# Patient Record
Sex: Female | Born: 1945 | Race: White | Hispanic: No | Marital: Married | Smoking: Former smoker
Health system: Southern US, Community
[De-identification: ages and names within clinical notes are randomized; demographics above are authoritative.]

## PROBLEM LIST (undated history)

## (undated) DIAGNOSIS — J45909 Unspecified asthma, uncomplicated: Secondary | ICD-10-CM

## (undated) DIAGNOSIS — C801 Malignant (primary) neoplasm, unspecified: Secondary | ICD-10-CM

## (undated) DIAGNOSIS — I10 Essential (primary) hypertension: Secondary | ICD-10-CM

## (undated) HISTORY — PX: PARATHYROIDECTOMY: SHX19

## (undated) HISTORY — PX: BACK SURGERY: SHX140

---

## 1998-05-22 ENCOUNTER — Emergency Department (HOSPITAL_COMMUNITY): Admission: EM | Admit: 1998-05-22 | Discharge: 1998-05-22 | Payer: Self-pay | Admitting: Emergency Medicine

## 1998-05-22 ENCOUNTER — Encounter: Payer: Self-pay | Admitting: Emergency Medicine

## 1998-05-24 ENCOUNTER — Emergency Department (HOSPITAL_COMMUNITY): Admission: EM | Admit: 1998-05-24 | Discharge: 1998-05-24 | Payer: Self-pay | Admitting: Emergency Medicine

## 1998-05-25 ENCOUNTER — Encounter: Payer: Self-pay | Admitting: Emergency Medicine

## 2006-02-10 ENCOUNTER — Ambulatory Visit: Payer: Self-pay | Admitting: Family Medicine

## 2006-05-23 ENCOUNTER — Ambulatory Visit: Payer: Self-pay | Admitting: Gastroenterology

## 2006-08-12 ENCOUNTER — Ambulatory Visit: Payer: Self-pay | Admitting: Internal Medicine

## 2007-02-13 ENCOUNTER — Ambulatory Visit: Payer: Self-pay | Admitting: Gastroenterology

## 2007-04-27 DIAGNOSIS — C801 Malignant (primary) neoplasm, unspecified: Secondary | ICD-10-CM

## 2007-04-27 HISTORY — DX: Malignant (primary) neoplasm, unspecified: C80.1

## 2007-11-27 ENCOUNTER — Ambulatory Visit: Payer: Self-pay | Admitting: Family Medicine

## 2008-09-04 ENCOUNTER — Ambulatory Visit: Payer: Self-pay | Admitting: Family Medicine

## 2012-04-07 ENCOUNTER — Ambulatory Visit: Payer: Self-pay | Admitting: Gastroenterology

## 2012-08-08 ENCOUNTER — Ambulatory Visit: Payer: Self-pay | Admitting: Neurology

## 2012-08-17 ENCOUNTER — Ambulatory Visit: Payer: Self-pay | Admitting: Neurology

## 2012-09-11 ENCOUNTER — Ambulatory Visit: Payer: Self-pay | Admitting: General Practice

## 2012-09-11 LAB — CBC WITH DIFFERENTIAL/PLATELET
Basophil #: 0.1 10*3/uL (ref 0.0–0.1)
Basophil %: 1 %
Eosinophil #: 0.2 10*3/uL (ref 0.0–0.7)
Eosinophil %: 3.1 %
HCT: 37.7 % (ref 35.0–47.0)
HGB: 12.7 g/dL (ref 12.0–16.0)
Lymphocyte #: 1.6 10*3/uL (ref 1.0–3.6)
Lymphocyte %: 26.9 %
MCH: 29.9 pg (ref 26.0–34.0)
MCHC: 33.8 g/dL (ref 32.0–36.0)
MCV: 89 fL (ref 80–100)
Monocyte #: 0.4 x10 3/mm (ref 0.2–0.9)
Monocyte %: 7.6 %
Neutrophil #: 3.6 10*3/uL (ref 1.4–6.5)
Neutrophil %: 61.4 %
Platelet: 242 10*3/uL (ref 150–440)
RBC: 4.25 10*6/uL (ref 3.80–5.20)
RDW: 14 % (ref 11.5–14.5)
WBC: 5.8 10*3/uL (ref 3.6–11.0)

## 2012-09-15 ENCOUNTER — Ambulatory Visit: Payer: Self-pay | Admitting: General Practice

## 2013-01-25 ENCOUNTER — Emergency Department: Payer: Self-pay | Admitting: Emergency Medicine

## 2013-01-25 LAB — COMPREHENSIVE METABOLIC PANEL
Albumin: 3.7 g/dL (ref 3.4–5.0)
Alkaline Phosphatase: 104 U/L (ref 50–136)
Anion Gap: 8 (ref 7–16)
BUN: 15 mg/dL (ref 7–18)
Bilirubin,Total: 0.3 mg/dL (ref 0.2–1.0)
Calcium, Total: 10 mg/dL (ref 8.5–10.1)
Chloride: 103 mmol/L (ref 98–107)
Co2: 27 mmol/L (ref 21–32)
Creatinine: 0.83 mg/dL (ref 0.60–1.30)
EGFR (African American): >60
EGFR (Non-African Amer.): >60
Glucose: 118 mg/dL — ABNORMAL HIGH (ref 65–99)
Osmolality: 278 (ref 275–301)
Potassium: 3.6 mmol/L (ref 3.5–5.1)
SGOT(AST): 28 U/L (ref 15–37)
SGPT (ALT): 31 U/L (ref 12–78)
Sodium: 138 mmol/L (ref 136–145)
Total Protein: 7.7 g/dL (ref 6.4–8.2)

## 2013-01-25 LAB — CBC
HCT: 40.7 % (ref 35.0–47.0)
HGB: 13.8 g/dL (ref 12.0–16.0)
MCH: 30 pg (ref 26.0–34.0)
MCHC: 33.9 g/dL (ref 32.0–36.0)
MCV: 88 fL (ref 80–100)
Platelet: 251 10*3/uL (ref 150–440)
RBC: 4.61 10*6/uL (ref 3.80–5.20)
RDW: 13.2 % (ref 11.5–14.5)
WBC: 7.9 10*3/uL (ref 3.6–11.0)

## 2013-04-08 ENCOUNTER — Ambulatory Visit: Payer: Self-pay | Admitting: Medical

## 2013-08-16 ENCOUNTER — Ambulatory Visit: Payer: Self-pay | Admitting: Family Medicine

## 2014-04-06 ENCOUNTER — Emergency Department: Payer: Self-pay | Admitting: Internal Medicine

## 2014-04-06 LAB — COMPREHENSIVE METABOLIC PANEL
ALK PHOS: 99 U/L
Albumin: 3.4 g/dL (ref 3.4–5.0)
Anion Gap: 7 (ref 7–16)
BUN: 17 mg/dL (ref 7–18)
Bilirubin,Total: 0.4 mg/dL (ref 0.2–1.0)
CALCIUM: 8.8 mg/dL (ref 8.5–10.1)
CHLORIDE: 105 mmol/L (ref 98–107)
Co2: 27 mmol/L (ref 21–32)
Creatinine: 0.8 mg/dL (ref 0.60–1.30)
EGFR (African American): >60
EGFR (Non-African Amer.): >60
Glucose: 105 mg/dL — ABNORMAL HIGH (ref 65–99)
Osmolality: 279 (ref 275–301)
Potassium: 4 mmol/L (ref 3.5–5.1)
SGOT(AST): 31 U/L (ref 15–37)
SGPT (ALT): 27 U/L
Sodium: 139 mmol/L (ref 136–145)
TOTAL PROTEIN: 7.5 g/dL (ref 6.4–8.2)

## 2014-04-06 LAB — CBC
HCT: 42.5 % (ref 35.0–47.0)
HGB: 13.7 g/dL (ref 12.0–16.0)
MCH: 29.3 pg (ref 26.0–34.0)
MCHC: 32.4 g/dL (ref 32.0–36.0)
MCV: 91 fL (ref 80–100)
Platelet: 265 10*3/uL (ref 150–440)
RBC: 4.69 10*6/uL (ref 3.80–5.20)
RDW: 13.7 % (ref 11.5–14.5)
WBC: 5.9 10*3/uL (ref 3.6–11.0)

## 2014-04-06 LAB — TROPONIN I

## 2014-08-16 NOTE — Consult Note (Signed)
PATIENT NAME:  Erica Morrow, Erica Morrow MR#:  [id number] DATE OF BIRTH:  1945/12/28  DATE OF CONSULTATION:  01/25/2013  REFERRING PHYSICIAN:  Graciella Freer CONSULTING PHYSICIAN:  Jerene Bears, MD  REASON FOR CONSULTATION: Respiratory distress.   HISTORY OF PRESENT ILLNESS: The patient is a 69 year old female who is brought today by EMS for respiratory distress and inspiratory stridor. She has a history of asthma, as well as cervical fusion, and presented to the Emergency Room. She was satting in the high 90s on room air, but due to the respiratory distress as well as audible stridor, Morrow was called for evaluation of her airway. The patient demonstrates she has had asthma attacks before in the past, but this feels different. She did try a new inhaler today that is different than before. She ate nothing out of the ordinary earlier today, and denies taking any new medications. She denies any fever or significant sick contacts.   PAST MEDICAL HISTORY: Atrial fibrillation and asthma.   PAST SURGICAL HISTORY:  Parathyroidectomy and cervical fusion.   SOCIAL HISTORY: The patient lives in [place], [place]. Works at [employer], and is here with her family member.   ALLERGIES: ADVAIR DISKUS, FIORINAL AND HYDROCHLOROTHIAZIDE.   CURRENT MEDICATIONS: ProAir, Paxil, Norco, metoprolol, lisinopril, gabapentin, etodolac.  PHYSICAL EXAMINATION: VITAL SIGNS: Temperature is 99. Pulse 100, respirations 20, blood pressure 143/68. O2 is 96% on room air.  GENERAL: She is a well-nourished, well-developed female in mild distress. There is some intermittent inspiratory stridor. She is able to phonate at times normally and other times at a whisper. She does have a good cough reflex.  HEENT:  Ears: EACs are clear bilaterally. TMs intact.EACS clear anteriorly. Oral cavity and oropharynx reveals no abnormal masses or lesions.  NECK: Supple. There is no substernal retractions.   PROCEDURE: Transnasal  flexible laryngoscopy.   PREPROCEDURE DIAGNOSIS: Respiratory distress.   POSTPROCEDURE DIAGNOSIS:  Respiratory distress.  DESCRIPTION OF PROCEDURE: Verbal consent was obtained. The patient's nasal cavity was sprayed with topical Afrin only, and transnasal flexible laryngoscopy was performed. This demonstrated no significant abnormal masses or lesions in the patient's nasal cavity, nasopharynx, pharynx or larynx. True vocal folds were mobile bilaterally. There is widely patent subglottic area. The postcricoid region revealed some mild edema consistent with reflux, but no significant erythema. Airway was widely patent. The patient did not have inspiratory stridor during first half of the scope, and then when asked to breathe deep, the vocal cords slammed together and created an inspiratory stridor sound, but the airway was widely patent.   IMPRESSION: Inspiratory stridor, respiratory distress in a patient with asthma with no evidence of abnormal mass or lesion or swelling on transnasal flexible laryngoscopy.   PLAN: Morrow discussed my findings with the patient, as well as Dr. Jasmine December. Morrow discussed with the patient that everything looks normal. She has normal movement of her vocal cords, but that she is actually creating the sound herself whenever she breathes in very deeply with the closure of her cords. Morrow have recommended further evaluation for her asthma exacerbation, but again, reassured her that her airway is normal in appearance. Morrow will see her back as needed.     ____________________________ Jerene Bears, MD ccv:dmm D: 01/25/2013 21:48:31 ET T: 01/25/2013 22:09:13 ET JOB#: [id number]  cc: Jerene Bears, MD, <Dictator> Jerene Bears MD ELECTRONICALLY SIGNED 02/07/2013 6:43

## 2014-08-16 NOTE — Op Note (Signed)
PATIENT NAME:  Erica Morrow, RAMAKER I MR#:  [id number] DATE OF BIRTH:  02-Sep-1945  DATE OF PROCEDURE:  09/15/2012  DATE OF PROCEDURE: 09/15/2012.   PREOPERATIVE DIAGNOSIS: Left carpal tunnel syndrome.   POSTOPERATIVE DIAGNOSIS: Left carpal tunnel syndrome.   PROCEDURE PERFORMED: Left carpal tunnel release.   SURGEON: Laurice Record. Hooten, MD  ANESTHESIA: General.   ESTIMATED BLOOD LOSS: Minimal.   TOURNIQUET TIME:  28 minutes.   DRAINS: None.   INDICATIONS FOR SURGERY:  The patient is a 69 year old female who has been seen for complaints of numbness and paresthesias to the left hand. EMG nerve conduction studies were consistent with moderately severe to severe left carpal tunnel syndrome. After discussion of the risks and benefits of surgical intervention, the patient expressed understanding of the risks and benefits and agreed with plans for surgical intervention.   PROCEDURE IN DETAIL: The patient was brought to the Operating Room and after adequate general anesthesia was achieved, a tourniquet was placed in the patient's upper left arm. The patient's left hand and arm were cleaned and prepped with alcohol and DuraPrep draped in the usual sterile fashion. A "timeout" was performed as per usual protocol. The left upper extremity was exsanguinated using an Esmarch, and the tourniquet was inflated to 250 mmHg. Magnification was used throughout the procedure. A curvilinear incision was made just ulnar to the thenar palmar crease. Dissection was carried down through the palmar fascia to the transverse carpal ligament. Transverse carpal ligament was sharply incised, taking care to protect the underlying structures within the carpal tunnel. Complete release of the transverse carpal ligament was achieved. Inspection of the canal showed no evidence of lipoma or ganglion cyst. The wound was irrigated with copious amounts of normal saline with antibiotic solution. Skin edges were then reapproximated using  interrupted sutures of 5-0 nylon. 10 mL of 0.25% Marcaine was injected along the incision site. Sterile dressing was applied followed by application of volar splint. Tourniquet was deflated after total tourniquet time of 28 minutes.   The patient tolerated the procedure well. She was transported to the recovery room in stable condition.     ____________________________ Laurice Record. Holley Bouche., MD jph:ct D: 09/15/2012 10:54:49 ET T: 09/15/2012 12:05:45 ET JOB#: [id number]  cc: Jeneen Rinks P. Holley Bouche., MD, <Dictator> Laurice Record Holley Bouche MD ELECTRONICALLY SIGNED 09/27/2012 19:42

## 2014-10-08 ENCOUNTER — Other Ambulatory Visit: Payer: Self-pay | Admitting: Family Medicine

## 2014-10-08 DIAGNOSIS — Z1231 Encounter for screening mammogram for malignant neoplasm of breast: Secondary | ICD-10-CM

## 2014-10-10 ENCOUNTER — Other Ambulatory Visit: Payer: Self-pay | Admitting: Family Medicine

## 2014-10-10 DIAGNOSIS — N63 Unspecified lump in unspecified breast: Secondary | ICD-10-CM

## 2014-10-10 DIAGNOSIS — Z1231 Encounter for screening mammogram for malignant neoplasm of breast: Secondary | ICD-10-CM

## 2014-10-31 ENCOUNTER — Ambulatory Visit: Payer: Medicare Other

## 2014-10-31 ENCOUNTER — Ambulatory Visit: Payer: Self-pay

## 2014-10-31 ENCOUNTER — Ambulatory Visit
Admission: RE | Admit: 2014-10-31 | Discharge: 2014-10-31 | Disposition: A | Discharge status: Home / Self Care | Payer: Medicare Other | Source: Ambulatory Visit | Attending: Family Medicine | Admitting: Family Medicine

## 2014-10-31 DIAGNOSIS — N6002 Solitary cyst of left breast: Secondary | ICD-10-CM | POA: Diagnosis not present

## 2014-10-31 DIAGNOSIS — Z1231 Encounter for screening mammogram for malignant neoplasm of breast: Secondary | ICD-10-CM

## 2014-10-31 DIAGNOSIS — N63 Unspecified lump in unspecified breast: Secondary | ICD-10-CM

## 2014-10-31 HISTORY — DX: Malignant (primary) neoplasm, unspecified: C80.1

## 2015-01-30 ENCOUNTER — Emergency Department
Admission: EM | Admit: 2015-01-30 | Discharge: 2015-01-30 | Disposition: A | Discharge status: Home / Self Care | Payer: Medicare Other | Attending: Emergency Medicine | Admitting: Emergency Medicine

## 2015-01-30 DIAGNOSIS — H9202 Otalgia, left ear: Secondary | ICD-10-CM | POA: Diagnosis present

## 2015-01-30 DIAGNOSIS — H6092 Unspecified otitis externa, left ear: Secondary | ICD-10-CM

## 2015-01-30 HISTORY — DX: Essential (primary) hypertension: I10

## 2015-01-30 HISTORY — DX: Unspecified asthma, uncomplicated: J45.909

## 2015-01-30 MED ORDER — OXYCODONE-ACETAMINOPHEN 5-325 MG PO TABS: 1.0000 | ORAL_TABLET | ORAL | Status: AC | PRN

## 2015-01-30 MED ORDER — NEOMYCIN-POLYMYXIN-HC 3.5-10000-1 OT SOLN: 3.0000 [drp] | Freq: Three times a day (TID) | OTIC | Status: AC

## 2015-01-30 NOTE — ED Notes (Signed)
Pt presents to ED with c'o LEFT ear pain since 9 pm last night. Pt denies any other c/o; denies core throat, fever, N/V. Denies any known trauma or injury, reports no recent trips to the beach or swimming. Pt reports last episode of earache was during childhood. Pt is &O, in NAD, with respirations regular, even and unlabored.

## 2015-01-30 NOTE — ED Notes (Signed)
Patient ambulatory to room 26.

## 2015-01-30 NOTE — ED Notes (Signed)
Pt reports not being able to hear anything out of the affected ear.

## 2015-01-30 NOTE — ED Notes (Signed)
Patient with left ear pain since 2100 last night. Denies drainage.

## 2015-01-30 NOTE — Discharge Instructions (Signed)
Please seek medical attention for any high fevers, chest pain, shortness of breath, change in behavior, persistent vomiting, bloody stool or any other new or concerning symptoms.   Otitis Externa Otitis externa is a bacterial or fungal infection of the outer ear canal. This is the area from the eardrum to the outside of the ear. Otitis externa is sometimes called "swimmer's ear." CAUSES  Possible causes of infection include:  Swimming in dirty water.  Moisture remaining in the ear after swimming or bathing.  Mild injury (trauma) to the ear.  Objects stuck in the ear (foreign body).  Cuts or scrapes (abrasions) on the outside of the ear. SIGNS AND SYMPTOMS  The first symptom of infection is often itching in the ear canal. Later signs and symptoms may include swelling and redness of the ear canal, ear pain, and yellowish-white fluid (pus) coming from the ear. The ear pain may be worse when pulling on the earlobe. DIAGNOSIS  Your health care provider will perform a physical exam. A sample of fluid may be taken from the ear and examined for bacteria or fungi. TREATMENT  Antibiotic ear drops are often given for 10 to 14 days. Treatment may also include pain medicine or corticosteroids to reduce itching and swelling. HOME CARE INSTRUCTIONS   Apply antibiotic ear drops to the ear canal as prescribed by your health care provider.  Take medicines only as directed by your health care provider.  If you have diabetes, follow any additional treatment instructions from your health care provider.  Keep all follow-up visits as directed by your health care provider. PREVENTION   Keep your ear dry. Use the corner of a towel to absorb water out of the ear canal after swimming or bathing.  Avoid scratching or putting objects inside your ear. This can damage the ear canal or remove the protective wax that lines the canal. This makes it easier for bacteria and fungi to grow.  Avoid swimming in lakes,  polluted water, or poorly chlorinated pools.  You may use ear drops made of rubbing alcohol and vinegar after swimming. Combine equal parts of white vinegar and alcohol in a bottle. Put 3 or 4 drops into each ear after swimming. SEEK MEDICAL CARE IF:   You have a fever.  Your ear is still red, swollen, painful, or draining pus after 3 days.  Your redness, swelling, or pain gets worse.  You have a severe headache.  You have redness, swelling, pain, or tenderness in the area behind your ear. MAKE SURE YOU:   Understand these instructions.  Will watch your condition.  Will get help right away if you are not doing well or get worse.   This information is not intended to replace advice given to you by your health care provider. Make sure you discuss any questions you have with your health care provider.   Document Released: 04/12/2005 Document Revised: 05/03/2014 Document Reviewed: 04/29/2011 Elsevier Interactive Patient Education [employer].

## 2015-01-30 NOTE — ED Provider Notes (Signed)
[facility] Emergency Department Provider Note    ____________________________________________  Time seen: 0605  I have reviewed the triage vital signs and the nursing notes.   HISTORY  Chief Complaint Otalgia   History limited by: Not Limited   HPI Erica Morrow is a 69 y.o. female who presents to the emergency Department with complaints of left ear pain. The patient states that she started having pain 2 days ago. She states that the pain has gone worse and was severe today. The patient states that the pain has been constant. She denies any recent trauma to the ear. Denies any recent swimming. Denies any recent airplane or scuba diving. Patient has not had any recent ear issues. She has had some associated hearing change. Denies any ringing in her ears. Denies any fevers or vomiting. Denies any new medications.     Past Medical History  Diagnosis Date  . 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   No past surgical history on file.  Current Outpatient Rx  Name  Route  Sig  Dispense  Refill  . neomycin-polymyxin-hydrocortisone (CORTISPORIN) otic solution   Left Ear   Place 3 drops into the left ear 3 (three) times daily.   10 mL   0   . oxyCODONE-acetaminophen (ROXICET) 5-325 MG tablet   Oral   Take 1 tablet by mouth every 4 (four) hours as needed for severe pain.   10 tablet   0     Allergies Review of patient's allergies indicates not on file.  Family History  Problem Relation Age of Onset  . Breast cancer Sister 69    Social History Social History  Substance Use Topics  . Smoking status: Not on file  . Smokeless tobacco: Not on file  . Alcohol Use: Not on file    Review of Systems  Constitutional: Negative for fever. Cardiovascular: Negative for chest pain. Respiratory: Negative for shortness of breath. Gastrointestinal: Negative for abdominal pain, vomiting and  diarrhea. Genitourinary: Negative for dysuria. Musculoskeletal: Negative for back pain. Skin: Negative for rash. Neurological: Negative for headaches, focal weakness or numbness.   10-point ROS otherwise negative.  ____________________________________________   PHYSICAL EXAM:  VITAL SIGNS: ED Triage Vitals  Enc Vitals Group     BP 01/30/15 0550 132/86 mmHg     Pulse Rate 01/30/15 0550 86     Resp 01/30/15 0550 18     Temp 01/30/15 0550 97.8 F (36.6 C)     Temp Source 01/30/15 0550 Oral     SpO2 01/30/15 0550 96 %     Weight 01/30/15 0545 250 lb (113.399 kg)     Height 01/30/15 0545 5\' 8"  (1.727 m)     Head Cir --      Peak Flow --      Pain Score 01/30/15 0546 7   Constitutional: Alert and oriented. Well appearing and in no distress. Eyes: Conjunctivae are normal. PERRL. Normal extraocular movements. ENT      Ears: Right ear wnl. Left ear with some redness and swelling to the pinna, external auditory canal with erythema and purulent debris. Tender with manipulation.   Head: Normocephalic and atraumatic.   Nose: No congestion/rhinnorhea.   Mouth/Throat: Mucous membranes are moist.   Neck: No stridor. Hematological/Lymphatic/Immunilogical: No cervical lymphadenopathy. Cardiovascular: Normal rate, regular rhythm.  No murmurs, rubs, or gallops. Respiratory: Normal respiratory effort without tachypnea nor retractions. Breath sounds are  clear and equal bilaterally. No wheezes/rales/rhonchi. Musculoskeletal: Normal range of motion in all extremities. No joint effusions.  No lower extremity tenderness nor edema. Neurologic:  Normal speech and language. No gross focal neurologic deficits are appreciated. Speech is normal.  Skin:  Skin is warm, dry and intact. No rash noted. Psychiatric: Mood and affect are normal. Speech and behavior are normal. Patient exhibits appropriate insight and judgment.  ____________________________________________    LABS (pertinent  positives/negatives)  None  ____________________________________________   EKG  None  ____________________________________________    RADIOLOGY  None   ____________________________________________   PROCEDURES  Procedure(s) performed: None  Critical Care performed: No  ____________________________________________   INITIAL IMPRESSION / ASSESSMENT AND PLAN / ED COURSE  Pertinent labs & imaging results that were available during my care of the patient were reviewed by me and considered in my medical decision making (see chart for details).  Patient presents to the emergency department today with concerns for left ear pain. Exam is consistent with otitis externa. Will place patient on eardrops. Will have patient follow-up with ENT.  ____________________________________________   FINAL CLINICAL IMPRESSION(S) / ED DIAGNOSES  Final diagnoses:  Otitis externa, left     Nance Pear, MD 01/30/15 [phone]

## 2015-01-31 ENCOUNTER — Encounter: Payer: Self-pay | Admitting: Emergency Medicine

## 2015-01-31 ENCOUNTER — Ambulatory Visit
Admission: EM | Admit: 2015-01-31 | Discharge: 2015-01-31 | Disposition: A | Discharge status: Home / Self Care | Payer: Medicare Other | Attending: Family Medicine | Admitting: Family Medicine

## 2015-01-31 DIAGNOSIS — H6092 Unspecified otitis externa, left ear: Secondary | ICD-10-CM | POA: Diagnosis not present

## 2015-01-31 DIAGNOSIS — R220 Localized swelling, mass and lump, head: Secondary | ICD-10-CM | POA: Diagnosis not present

## 2015-01-31 NOTE — ED Provider Notes (Signed)
CSN: [id number]     Arrival date & time 01/31/15  1631 History   First MD Initiated Contact with Patient 01/31/15 1646    Nurses notes were reviewed.  Chief Complaint  Patient presents with  . Otalgia    Patient is in pain and distressed. She reports going to the emergency room yesterday morning to be seen for left ear pain that woke her up out of sleep. She denies any trouble before then. States they did nothing for her but gave her pain pills and put her on some eardrops. She states the pain is excruciating and something needs to be done (Consider location/radiation/quality/duration/timing/severity/associated sxs/prior Treatment) Patient is a 69 y.o. female presenting with ear pain. The history is provided by the patient.  Otalgia Location:  Left Behind ear:  Swelling Quality:  Pressure, aching and shooting Severity:  Moderate Onset quality:  Sudden (Time started Thursday morning woke her out of sleep) Duration:  30 hours Progression:  Worsening Chronicity:  New Relieved by:  Nothing Ineffective treatments: Narcotic pain medication and the eardrops. Associated symptoms: ear discharge and headaches   Associated symptoms: no rash   Risk factors: no recent travel, no chronic ear infection and no prior ear surgery     Past Medical History  Diagnosis Date  . Cancer [facility]) 2009    Parathyroid ca  . Hypertension   . Asthma    Past Surgical History  Procedure Laterality Date  . Back surgery    . Parathyroidectomy     Family History  Problem Relation Age of Onset  . Breast cancer Sister 50   Social History  Substance Use Topics  . Smoking status: Former Smoker    Quit date: 01/30/1983  . Smokeless tobacco: None  . Alcohol Use: No   OB History    No data available     Review of Systems  HENT: Positive for ear discharge, ear pain and facial swelling.   Skin: Negative for rash.  Neurological: Positive for headaches.  All other systems reviewed and are  negative.   Allergies  Fiorinal  Home Medications   Prior to Admission medications   Medication Sig Start Date End Date Taking? Authorizing Provider  gabapentin (NEURONTIN) 600 MG tablet Take 600 mg by mouth 2 (two) times daily.    Historical Provider, MD  lisinopril (PRINIVIL,ZESTRIL) 10 MG tablet Take 10 mg by mouth daily.    Historical Provider, MD  meloxicam (MOBIC) 15 MG tablet Take 15 mg by mouth daily.    Historical Provider, MD  metoprolol tartrate (LOPRESSOR) 25 MG tablet Take 25 mg by mouth daily.    Historical Provider, MD  neomycin-polymyxin-hydrocortisone (CORTISPORIN) otic solution Place 3 drops into the left ear 3 (three) times daily. 01/30/15 02/09/15  Nance Pear, MD  Omega-3 Fatty Acids (FISH OIL) 1000 MG CAPS Take 2,000 mg by mouth daily.    Historical Provider, MD  oxyCODONE-acetaminophen (ROXICET) 5-325 MG tablet Take 1 tablet by mouth every 4 (four) hours as needed for severe pain. 01/30/15   Nance Pear, MD  PARoxetine (PAXIL) 20 MG tablet Take 20 mg by mouth daily.    Historical Provider, MD   Meds Ordered and Administered this Visit  Medications - No data to display  BP 112/70 mmHg  Pulse 73  Temp(Src) 98.3 F (36.8 C) (Tympanic)  Resp 18  Ht 5\' 8"  (1.727 m)  Wt 250 lb (113.399 kg)  BMI 38.02 kg/m2  SpO2 93% No data found.   Physical Exam  Constitutional:  She is oriented to person, place, and time. She appears well-developed and well-nourished.  HENT:  Head: Normocephalic.    Right Ear: Hearing, tympanic membrane, external ear and ear canal normal. No tenderness.  Left Ear: There is drainage, swelling and tenderness.  Patient has tenderness from the left ear down the mandible down the neck and left facial area as well. The left ear canal is completely closed this with local drainage but I will see swelling is present as well. Neck is supple  Eyes: Conjunctivae are normal. Pupils are equal, round, and reactive to light.  Neck: Normal range of  motion. Neck supple.  Musculoskeletal: Normal range of motion.  Lymphadenopathy:    She has cervical adenopathy.  Neurological: She is alert and oriented to person, place, and time.  Skin: Skin is warm.  Vitals reviewed.  Should also be stated that during the examination the patient there was some challenges as patient is moving away from me and I'll try to examine her and touch her face. ED Course  Procedures (including critical care time)  Labs Review Labs Reviewed - No data to display  Imaging Review No results found.   Visual Acuity Review  Right Eye Distance:   Left Eye Distance:   Bilateral Distance:    Right Eye Near:   Left Eye Near:    Bilateral Near:         MDM   1. Left otitis externa   2. Left facial swelling     The amount of swelling of her face and left ear hives strongly suggest the patient that instead of going from ER to an urgent care she needs to go to another ER that has a ENT available. The ER at [facility] recommend she contact Dr. Richardson Landry on Saturday she states she called his office today and there was take to see her be Monday.  Frederich Cha, MD 01/31/15 [phone]

## 2015-01-31 NOTE — Discharge Instructions (Signed)
Otitis Externa °Otitis externa is a germ infection in the outer ear. The outer ear is the area from the eardrum to the outside of the ear. Otitis externa is sometimes called "swimmer's ear." °HOME CARE °· Put drops in the ear as told by your doctor. °· Only take medicine as told by your doctor. °· If you have diabetes, your doctor may give you more directions. Follow your doctor's directions. °· Keep all doctor visits as told. °To avoid another infection: °· Keep your ear dry. Use the corner of a towel to dry your ear after swimming or bathing. °· Avoid scratching or putting things inside your ear. °· Avoid swimming in lakes, dirty water, or pools that use a chemical called chlorine poorly. °· You may use ear drops after swimming. Combine equal amounts of white vinegar and alcohol in a bottle. Put 3 or 4 drops in each ear. °GET HELP IF:  °· You have a fever. °· Your ear is still red, puffy (swollen), or painful after 3 days. °· You still have yellowish-white fluid (pus) coming from the ear after 3 days. °· Your redness, puffiness, or pain gets worse. °· You have a really bad headache. °· You have redness, puffiness, pain, or tenderness behind your ear. °MAKE SURE YOU:  °· Understand these instructions. °· Will watch your condition. °· Will get help right away if you are not doing well or get worse. °  °This information is not intended to replace advice given to you by your health care provider. Make sure you discuss any questions you have with your health care provider. °  °Document Released: 09/29/2007 Document Revised: 05/03/2014 Document Reviewed: 04/29/2011 °Elsevier Interactive Patient Education ©2016 [employer] ° °Ear Drops, Adult °You need to put eardrops in your ear. °HOME CARE  °· Put drops in your affected ear as told. °· After putting in the drops, lie down with the ear you put the drops in facing up. Stay this way for 10 minutes. Use the ear drops as long as your doctor tells you. °· Before you get  up, put a cotton ball gently in your ear. Do not push it far in your ear. °· Do not wash out your ears unless your doctor says it is okay. °· Finish all medicines as told by your doctor. You may be told to keep using the eardrops even if you start to feel better. °· See your doctor as told for follow-up visits. °GET HELP IF: °· You have pain that gets worse. °· Any unusual fluid (drainage) is coming from your ear (especially if the fluid stinks). °· You have trouble hearing. °· You get really dizzy as if the room is spinning and feel sick to your stomach (vertigo). °· The outside of your ear becomes red or puffy or both. This may be a sign of an allergic reaction. °MAKE SURE YOU:  °· Understand these instructions. °· Will watch your condition. °· Will get help right away if you are not doing well or get worse. °  °This information is not intended to replace advice given to you by your health care provider. Make sure you discuss any questions you have with your health care provider. °  °Document Released: 09/30/2009 Document Revised: 05/03/2014 Document Reviewed: 11/07/2012 °Elsevier Interactive Patient Education ©2016 [employer] ° °

## 2015-01-31 NOTE — ED Notes (Signed)
Ear pain for 2 days. Seen in ER yesterday.

## 2015-03-29 IMAGING — CR DG KNEE COMPLETE 4+V*R*
1 series · 4 of 4 positions shown · non-contrast
Comparison: None.

CLINICAL DATA: Reported history of trauma

EXAM:
RIGHT KNEE - COMPLETE 4+ VIEW

[Series 1: oblique · 0.17mm/px · 4 of 4 slices shown]
[im 1/4]
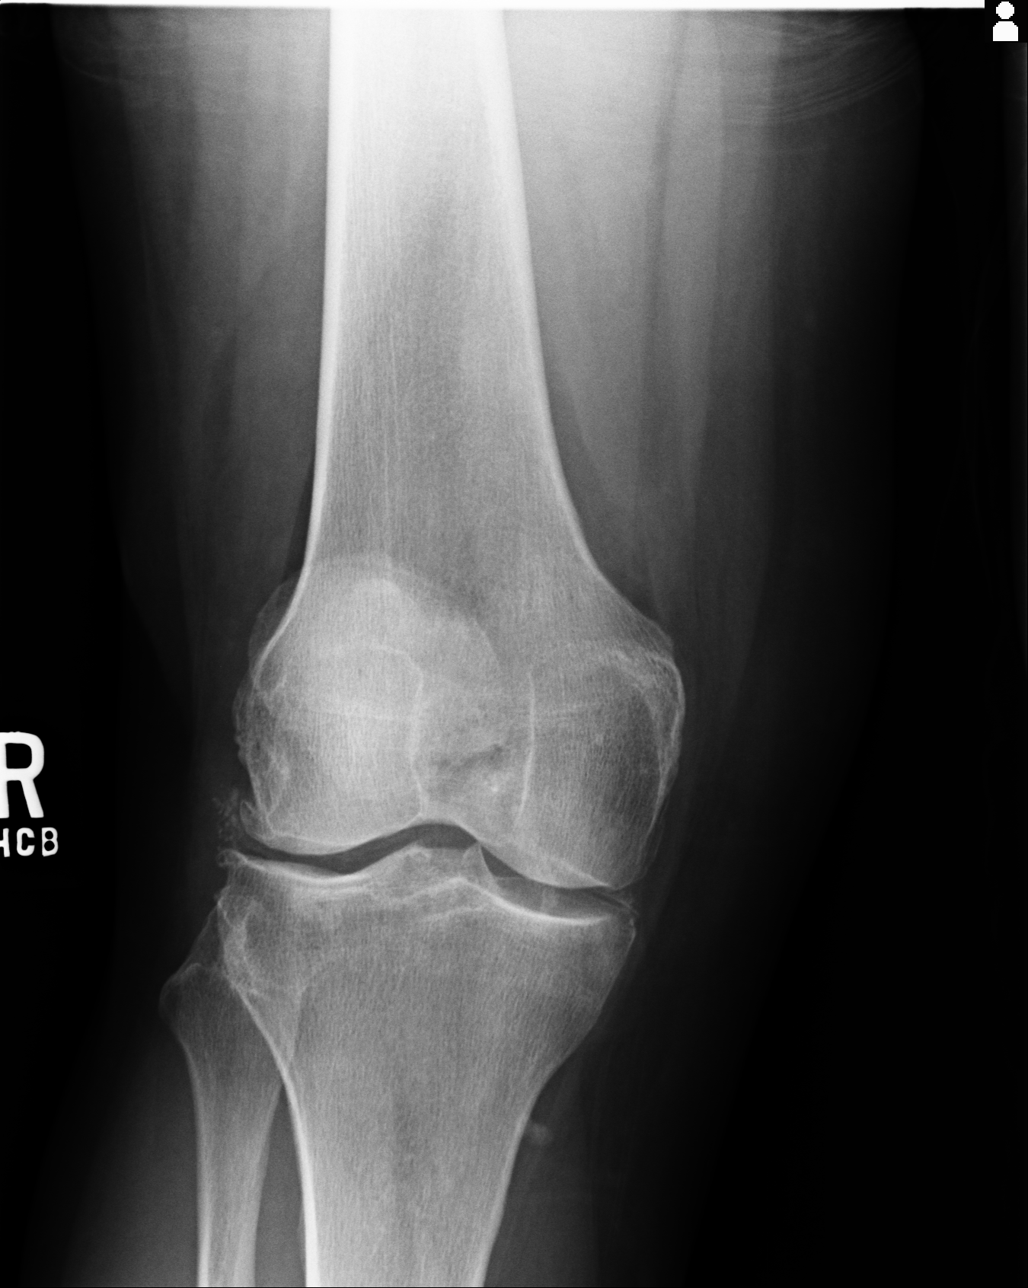
[im 2/4]
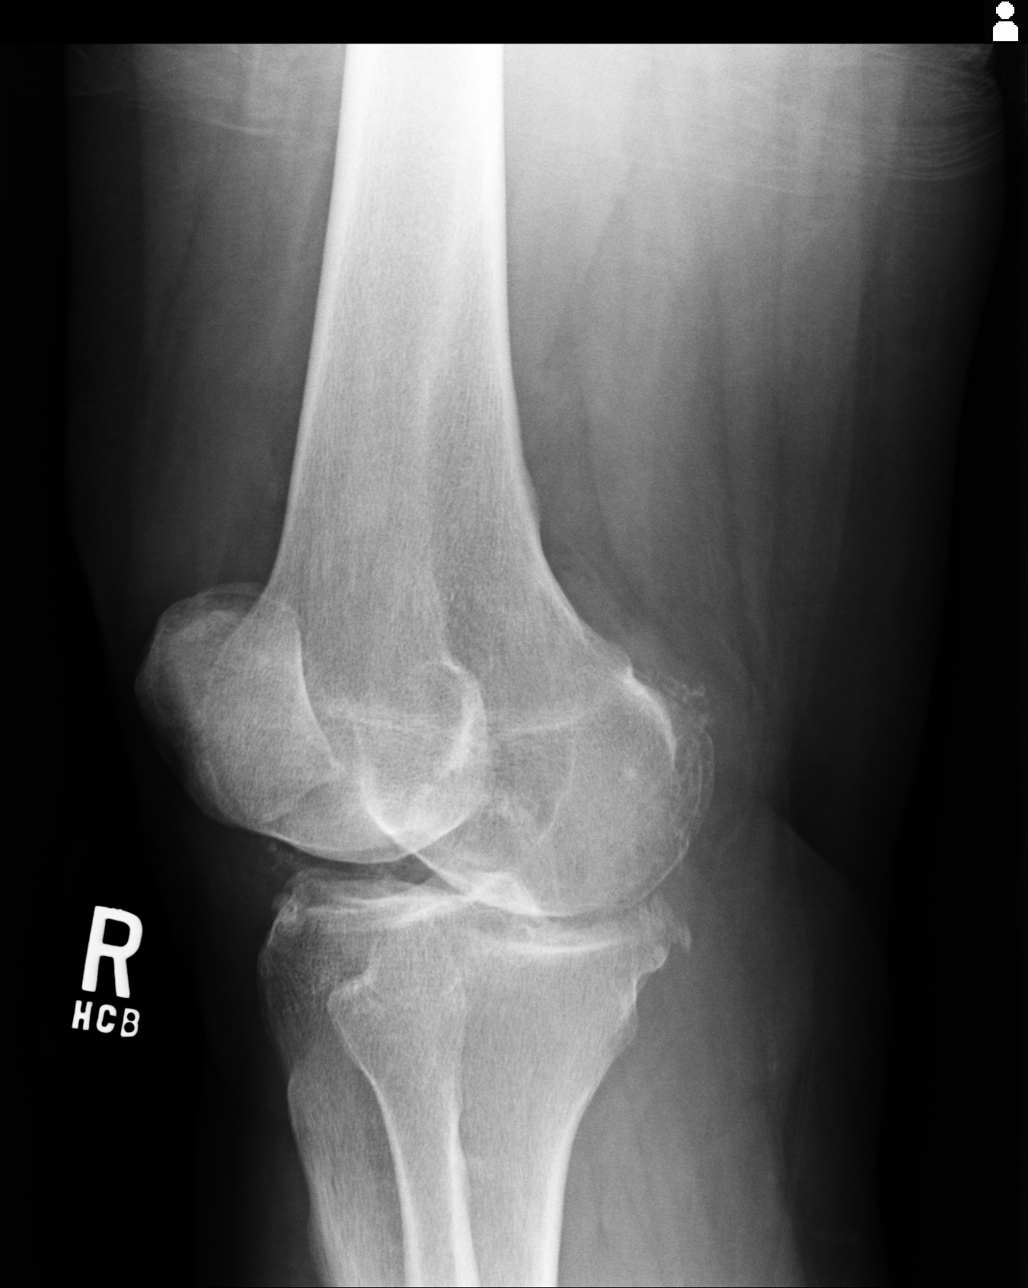
[im 3/4]
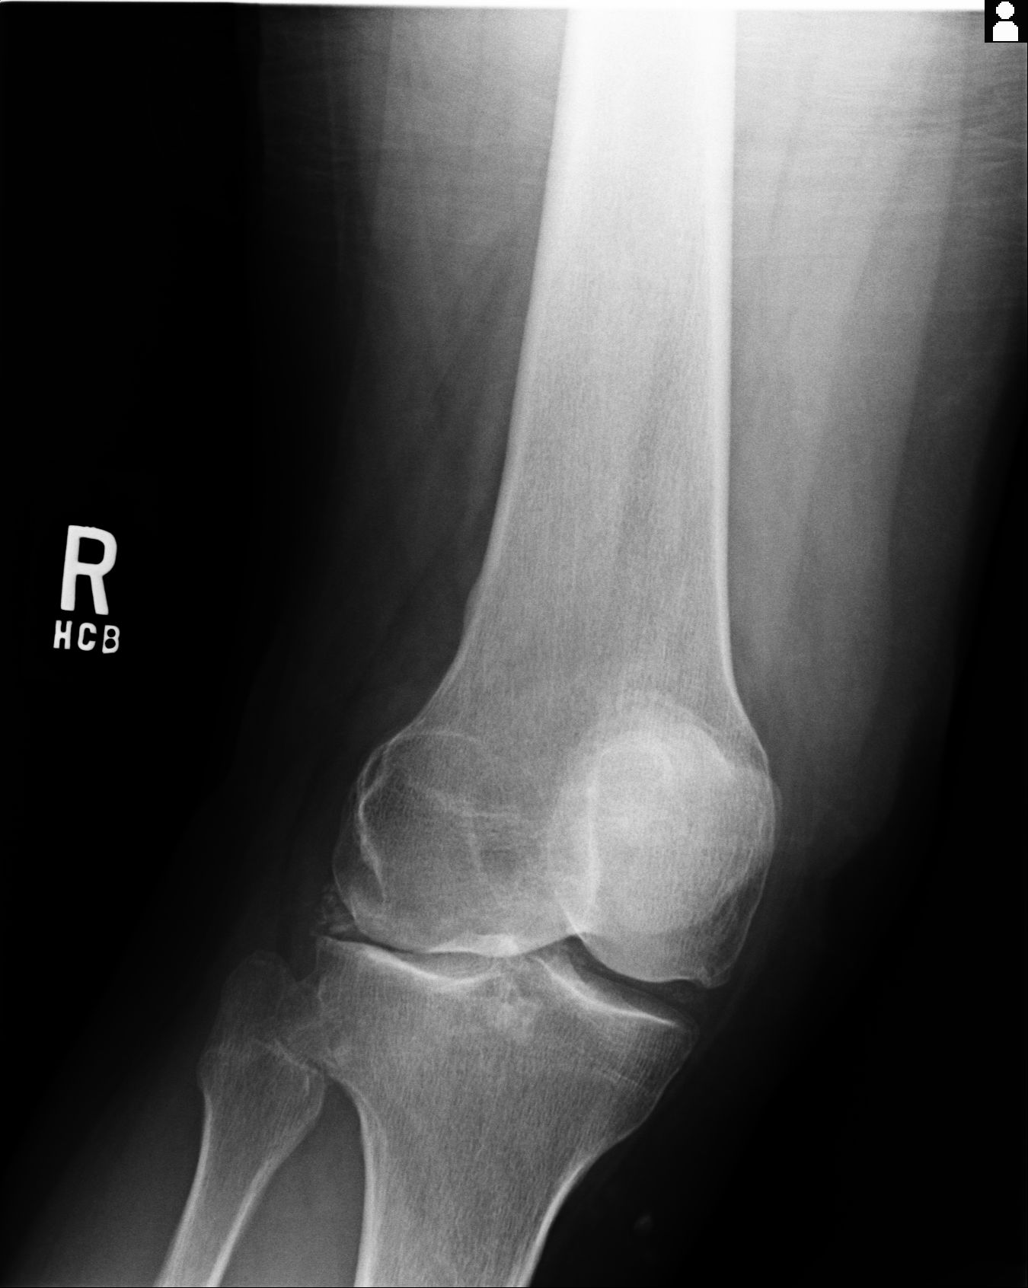
[im 4/4]
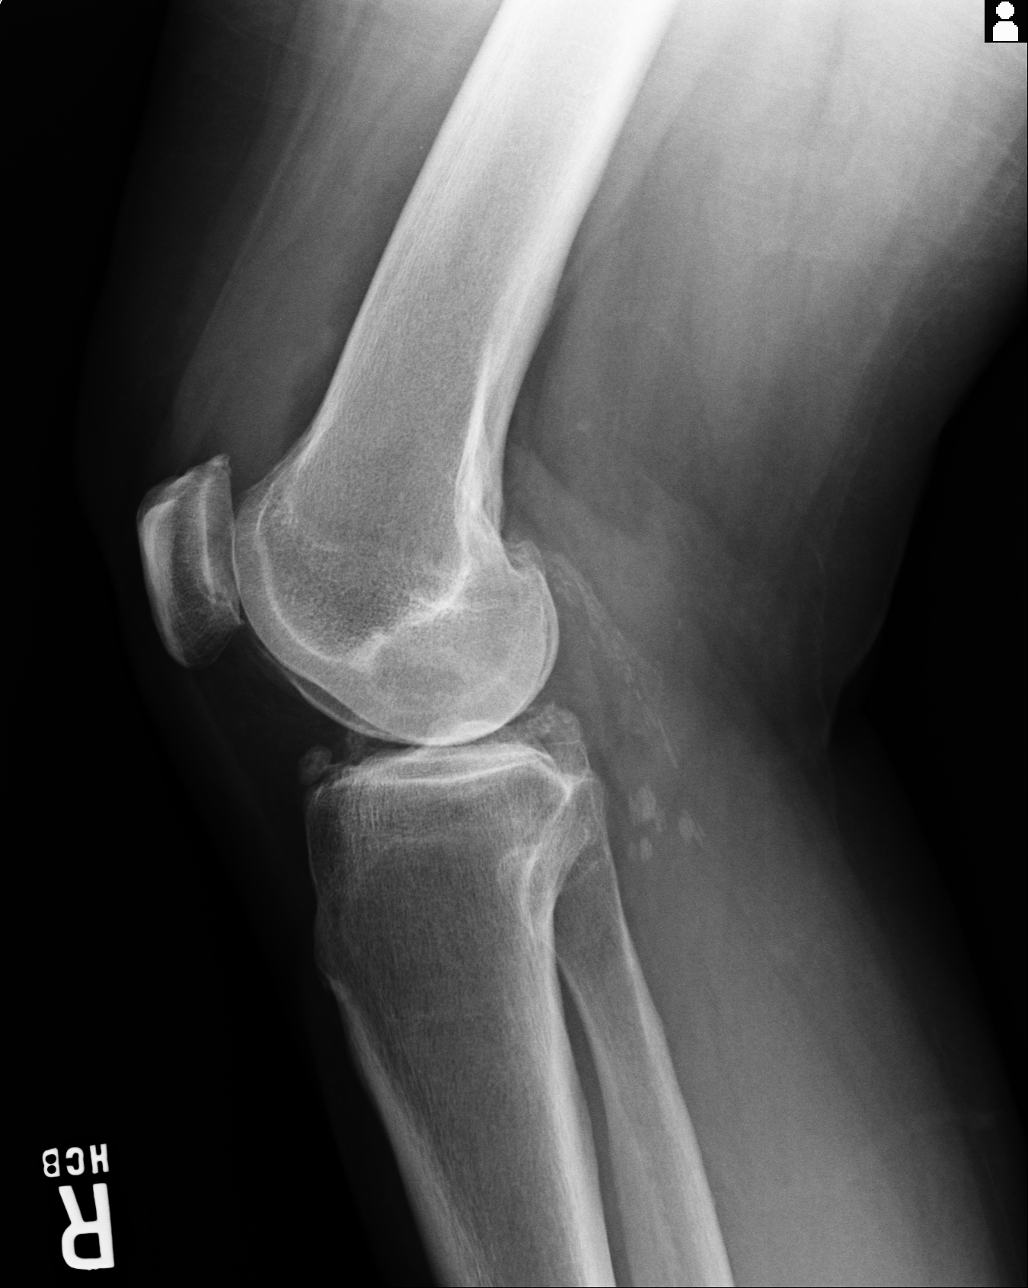

[4 of 4 positions shown; findings below may reference images not displayed]

FINDINGS: A small suprapatellar effusion is identified. There is no evidence
of acute fracture or dislocation. Peripheral areas of hypertrophic
spurring identified along the femoral condyles and tibial plateau
regions. Mild hypertrophic spurring is identified along the
articular surface the patella. Areas of chondrocalcinosis in the
medial and lateral compartments. Subchondral sclerosis identified
involving the medial and lateral tibial plateaus. Vague calcific
appearing densities project within the popliteal fossa these may
represent dystrophic calcifications and soft tissues possibly areas
of atherosclerotic calcifications.
IMPRESSION: Osteoarthritic changes without evidence of acute osseous
abnormalities. There is a small suprapatellar effusion likely
reactive. On lateral view calcified densities project within the
anterior aspect of the joint deep to Hoffa's fat pad. These areas
reflecting loose bodies within the joints versus attached
osteophytes. Clinically warranted further evaluation with
nonemergent MRI recommended.

## 2015-04-05 ENCOUNTER — Encounter: Payer: Self-pay | Admitting: *Deleted

## 2015-04-05 ENCOUNTER — Emergency Department: Payer: Medicare Other

## 2015-04-05 ENCOUNTER — Emergency Department
Admission: EM | Admit: 2015-04-05 | Discharge: 2015-04-05 | Disposition: A | Discharge status: Home / Self Care | Payer: Medicare Other | Attending: Emergency Medicine | Admitting: Emergency Medicine

## 2015-04-05 DIAGNOSIS — Z79899 Other long term (current) drug therapy: Secondary | ICD-10-CM | POA: Diagnosis not present

## 2015-04-05 DIAGNOSIS — I10 Essential (primary) hypertension: Secondary | ICD-10-CM | POA: Diagnosis not present

## 2015-04-05 DIAGNOSIS — R1032 Left lower quadrant pain: Secondary | ICD-10-CM | POA: Insufficient documentation

## 2015-04-05 DIAGNOSIS — M545 Low back pain: Secondary | ICD-10-CM | POA: Diagnosis present

## 2015-04-05 DIAGNOSIS — Z87891 Personal history of nicotine dependence: Secondary | ICD-10-CM | POA: Diagnosis not present

## 2015-04-05 DIAGNOSIS — M5442 Lumbago with sciatica, left side: Secondary | ICD-10-CM | POA: Insufficient documentation

## 2015-04-05 DIAGNOSIS — Z791 Long term (current) use of non-steroidal anti-inflammatories (NSAID): Secondary | ICD-10-CM | POA: Diagnosis not present

## 2015-04-05 MED ORDER — HYDROMORPHONE HCL 2 MG PO TABS
2.0000 mg | ORAL_TABLET | Freq: Once | ORAL | Status: AC
  Administered 2015-04-05: 2 mg via ORAL
  Filled 2015-04-05: qty 1

## 2015-04-05 MED ORDER — HYDROMORPHONE HCL 1 MG/ML IJ SOLN
1.0000 mg | Freq: Once | INTRAMUSCULAR | Status: AC
  Administered 2015-04-05: 1 mg via INTRAVENOUS
  Filled 2015-04-05: qty 1

## 2015-04-05 MED ORDER — ONDANSETRON HCL 4 MG/2ML IJ SOLN
4.0000 mg | Freq: Once | INTRAMUSCULAR | Status: AC
  Administered 2015-04-05: 4 mg via INTRAVENOUS
  Filled 2015-04-05: qty 2

## 2015-04-05 MED ORDER — KETOROLAC TROMETHAMINE 30 MG/ML IJ SOLN
30.0000 mg | Freq: Once | INTRAMUSCULAR | Status: AC
  Administered 2015-04-05: 30 mg via INTRAVENOUS
  Filled 2015-04-05: qty 1

## 2015-04-05 MED ORDER — HYDROMORPHONE HCL 2 MG PO TABS: 2.0000 mg | ORAL_TABLET | Freq: Four times a day (QID) | ORAL | Status: AC | PRN

## 2015-04-05 NOTE — ED Notes (Signed)
Pt with Hx of back pain, DDG, spinal fusion, radiates to left leg, no known new injury reported.

## 2015-04-05 NOTE — ED Notes (Signed)
PT RETURNED FROM xRAY

## 2015-04-05 NOTE — ED Provider Notes (Signed)
Time Seen: Approximately 1924 I have reviewed the triage notes  Chief Complaint: Back Pain   History of Present Illness: Erica Morrow is a 69 y.o. female who states that she's had recent pain located mainly in the left lower back region and radiates into the anterior portion of her left leg. She states she started having the discomfort yesterday and it was somewhat mild and then today she noticed the pain increasing especially with movement and had difficulty taking a shower due to the discomfort. She does not identify any leg weakness and pain is totally exacerbated by movement. She denies any new trauma. She denies any difficulty with urination or bowel movements. She denies any fever. Patient's had significant surgical history of a cervical disc and fusion at levels 2 through 5. Patient's had spinal fusion  Past Medical History  Diagnosis Date  . 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 Hypertension   . Asthma     There are no active problems to display for this patient.   Past Surgical History  Procedure Laterality Date  . Back surgery    . Parathyroidectomy      Past Surgical History  Procedure Laterality Date  . Back surgery    . Parathyroidectomy      Current Outpatient Rx  Name  Route  Sig  Dispense  Refill  . gabapentin (NEURONTIN) 600 MG tablet   Oral   Take 600 mg by mouth 2 (two) times daily.         Marland Kitchen lisinopril (PRINIVIL,ZESTRIL) 10 MG tablet   Oral   Take 10 mg by mouth daily.         . meloxicam (MOBIC) 15 MG tablet   Oral   Take 15 mg by mouth daily.         . metoprolol tartrate (LOPRESSOR) 25 MG tablet   Oral   Take 25 mg by mouth daily.         . Omega-3 Fatty Acids (FISH OIL) 1000 MG CAPS   Oral   Take 2,000 mg by mouth daily.         Marland Kitchen oxyCODONE-acetaminophen (ROXICET) 5-325 MG tablet   Oral   Take 1 tablet by mouth every 4 (four) hours as needed for severe pain.   10 tablet   0   . PARoxetine (PAXIL) 20 MG tablet    Oral   Take 20 mg by mouth daily.           Allergies:  Fiorinal  Family History: Family History  Problem Relation Age of Onset  . Breast cancer Sister 20    Social History: Social History  Substance Use Topics  . Smoking status: Former Smoker    Quit date: 01/30/1983  . Smokeless tobacco: Never Used  . Alcohol Use: No     Review of Systems:  10 point review of systems 1Constitutional: No fever Cardiac: No chest pain Respiratory: No shortness of breath, wheezing, or stridor Abdomen: She states some occasional left lower quadrant abdominal pain which she states her physician told her it may be ovarian in nature Extremities: No peripheral edema, cyanosis Skin: No rashes, easy bruising Neurologic: No focal weakness, trouble with speech or swollowing Urologic: No dysuria, Hematuria, or urinary frequency Patient denies any other joint pain No headaches Physical Exam:  ED Triage Vitals  Enc Vitals Group     BP 04/05/15 1920 140/80 mmHg     Pulse Rate 04/05/15 1920 116  Resp --      Temp 04/05/15 1920 98 F (36.7 C)     Temp Source 04/05/15 1920 Oral     SpO2 04/05/15 1920 96 %     Weight 04/05/15 1920 250 lb (113.399 kg)     Height 04/05/15 1920 5\' 8"  (1.727 m)     Head Cir --      Peak Flow --      Pain Score 04/05/15 1921 8     Pain Loc --      Pain Edu? --      Excl. in [place]? --     General: Awake , Alert , and Oriented times 3; GCS 15 Head: Normal cephalic , atraumatic Neck: Supple, Full range of motion, No anterior adenopathy or palpable thyroid masses Lungs: Clear to ascultation without wheezes , rhonchi, or rales Heart: Regular rate, regular rhythm without murmurs , gallops , or rubs Abdomen: Soft, non tender without rebound, guarding , or rigidity; bowel sounds positive and symmetric in all 4 quadrants. No organomegaly .        Extremities: 2 plus symmetric pulses. No edema, clubbing or cyanosis Neurologic: Patient has difficulty straightening out  her left leg but seems to have normal power with plantar flexion extension and left knee extension. No sensory deficits are noted. No reproducible pain over the lumbar spine and into the left hip. The patient's able to fill the bottom of her stretcher in her buttock region. Negative straight leg raise on the right Skin: warm, dry, no rashes    Radiology:    Final result by Rad Results In Interface (04/05/15 19:59:11)   Narrative:   CLINICAL DATA: Low back pain, no known injury, initial encounter  EXAM: LUMBAR SPINE - 2-3 VIEW  COMPARISON: None.  FINDINGS: Vertebral body height is well maintained. Disc space narrowing is noted at L4-5 as well as L3-4. Mild osteophytic changes are noted. No acute soft tissue abnormality is seen. Aortic calcifications are noted.  IMPRESSION: Multilevel degenerative change.        I personally reviewed the radiologic studies   ED Course:  Patient's stay here was uneventful. Patient was symptomatically improved after IV Dilaudid and some by mouth Dilaudid just prior to discharge. Patient does not exhibit symptoms consistent with cauda equina syndrome. I felt she did have some form of nerve impingement with inversion of sciatica based on her clinical presentation. Clinically unlikely to be a deep venous thrombosis or significant lumbar disc disease. Patient does not exhibit any intra-abdominal complaints. X-rays were performed due to her history of cancers no evidence of lytic lesions and shows disc space narrowing especially at L4-L5 etc.. Aortic calcifications are noted but no evidence of abdominal aneurysm and I felt this was unlikely presentation for abdominal aneurysm. Patient had symptomatic improvement with Dilaudid IV and will be discharged on Dilaudid. All questions and concerns were addressed at the bedside. She's been referred to her [facility] surgeon for further evaluation of her back pain for possible outpatient MRI, etc.    Assessment:   Left-sided sciatica      Plan: Outpatient management Patient was advised to return immediately if condition worsens. Patient was advised to follow up with their primary care physician or other specialized physicians involved in their outpatient care             Daymon Larsen, MD 04/05/15 2256

## 2015-04-05 NOTE — ED Notes (Addendum)
Patient transported to X-ray 

## 2015-04-05 NOTE — Discharge Instructions (Signed)
Sciatica °Sciatica is pain, weakness, numbness, or tingling along the path of the sciatic nerve. The nerve starts in the lower back and runs down the back of each leg. The nerve controls the muscles in the lower leg and in the back of the knee, while also providing sensation to the back of the thigh, lower leg, and the sole of your foot. Sciatica is a symptom of another medical condition. For instance, nerve damage or certain conditions, such as a herniated disk or bone spur on the spine, pinch or put pressure on the sciatic nerve. This causes the pain, weakness, or other sensations normally associated with sciatica. Generally, sciatica only affects one side of the body. °CAUSES  °· Herniated or slipped disc. °· Degenerative disk disease. °· A pain disorder involving the narrow muscle in the buttocks (piriformis syndrome). °· Pelvic injury or fracture. °· Pregnancy. °· Tumor (rare). °SYMPTOMS  °Symptoms can vary from mild to very severe. The symptoms usually travel from the low back to the buttocks and down the back of the leg. Symptoms can include: °· Mild tingling or dull aches in the lower back, leg, or hip. °· Numbness in the back of the calf or sole of the foot. °· Burning sensations in the lower back, leg, or hip. °· Sharp pains in the lower back, leg, or hip. °· Leg weakness. °· Severe back pain inhibiting movement. °These symptoms may get worse with coughing, sneezing, laughing, or prolonged sitting or standing. Also, being overweight may worsen symptoms. °DIAGNOSIS  °Your caregiver will perform a physical exam to look for common symptoms of sciatica. He or she may ask you to do certain movements or activities that would trigger sciatic nerve pain. Other tests may be performed to find the cause of the sciatica. These may include: °· Blood tests. °· X-rays. °· Imaging tests, such as an MRI or CT scan. °TREATMENT  °Treatment is directed at the cause of the sciatic pain. Sometimes, treatment is not necessary  and the pain and discomfort goes away on its own. If treatment is needed, your caregiver may suggest: °· Over-the-counter medicines to relieve pain. °· Prescription medicines, such as anti-inflammatory medicine, muscle relaxants, or narcotics. °· Applying heat or ice to the painful area. °· Steroid injections to lessen pain, irritation, and inflammation around the nerve. °· Reducing activity during periods of pain. °· Exercising and stretching to strengthen your abdomen and improve flexibility of your spine. Your caregiver may suggest losing weight if the extra weight makes the back pain worse. °· Physical therapy. °· Surgery to eliminate what is pressing or pinching the nerve, such as a bone spur or part of a herniated disk. °HOME CARE INSTRUCTIONS  °· Only take over-the-counter or prescription medicines for pain or discomfort as directed by your caregiver. °· Apply ice to the affected area for 20 minutes, 3-4 times a day for the first 48-72 hours. Then try heat in the same way. °· Exercise, stretch, or perform your usual activities if these do not aggravate your pain. °· Attend physical therapy sessions as directed by your caregiver. °· Keep all follow-up appointments as directed by your caregiver. °· Do not wear high heels or shoes that do not provide proper support. °· Check your mattress to see if it is too soft. A firm mattress may lessen your pain and discomfort. °SEEK IMMEDIATE MEDICAL CARE IF:  °· You lose control of your bowel or bladder (incontinence). °· You have increasing weakness in the lower back, pelvis, buttocks,   or legs.  You have redness or swelling of your back.  You have a burning sensation when you urinate.  You have pain that gets worse when you lie down or awakens you at night.  Your pain is worse than you have experienced in the past.  Your pain is lasting longer than 4 weeks.  You are suddenly losing weight without reason. MAKE SURE YOU:  Understand these  instructions.  Will watch your condition.  Will get help right away if you are not doing well or get worse.   This information is not intended to replace advice given to you by your health care provider. Make sure you discuss any questions you have with your health care provider.   Document Released: 04/06/2001 Document Revised: 01/01/2015 Document Reviewed: 08/22/2011 Elsevier Interactive Patient Education [employer].  Please return immediately if condition worsens. Please contact her primary physician or the physician you were given for referral. If you have any specialist physicians involved in her treatment and plan please also contact them. Thank you for using [facility]. Return especially for fever, increased abdominal pain, incontinence of stool or urine (decreased sensation across the buttock region), leg weakness, or any other new concerns

## 2016-03-26 IMAGING — CR DG CHEST 1V PORT
1 series · 1 of 1 positions shown · non-contrast
Comparison: None.

CLINICAL DATA: Left-sided chest pain radiating to left neck.
Dyspnea.

EXAM:
PORTABLE CHEST - 1 VIEW

[ap]
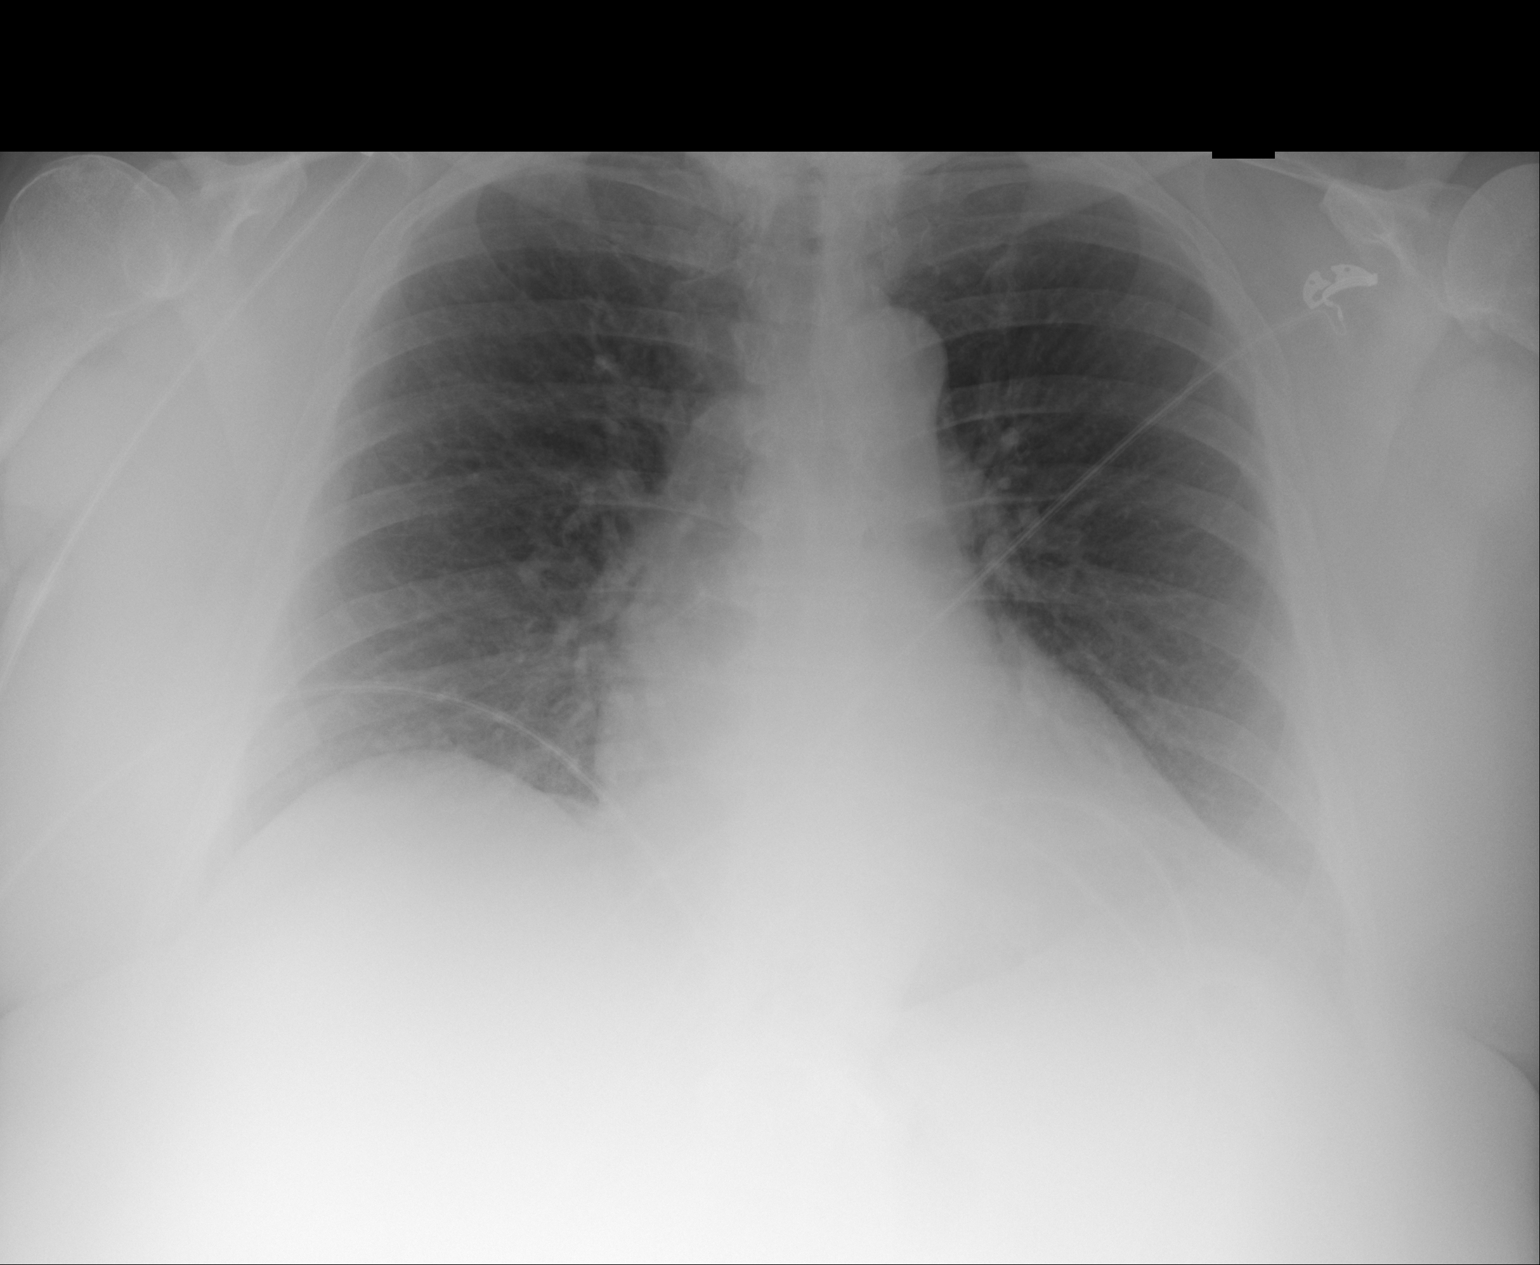

[1 of 1 positions shown; findings below may reference images not displayed]

FINDINGS: The heart size and mediastinal contours are within normal limits.
Low lung volumes noted. Both lungs are clear. The visualized
skeletal structures are unremarkable.
IMPRESSION: No active disease.
